# Patient Record
Sex: Female | Born: 2005 | Race: Black or African American | Hispanic: No | Marital: Single | Smoking: Never smoker
Health system: Southern US, Community
[De-identification: ages and names within clinical notes are randomized; demographics above are authoritative.]

## PROBLEM LIST (undated history)

## (undated) DIAGNOSIS — T7840XA Allergy, unspecified, initial encounter: Secondary | ICD-10-CM

## (undated) HISTORY — DX: Allergy, unspecified, initial encounter: T78.40XA

---
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
## 2024-02-26 ENCOUNTER — Other Ambulatory Visit: Payer: Self-pay | Admitting: Pediatrics

## 2024-02-26 NOTE — Progress Notes (Signed)
Letter written for school.

## 2024-04-14 ENCOUNTER — Encounter: Payer: Self-pay | Admitting: Pediatrics

## 2024-04-14 ENCOUNTER — Ambulatory Visit: Admitting: Pediatrics

## 2024-04-14 VITALS — BP 112/70 | Ht 66.0 in | Wt 148.4 lb

## 2024-04-14 DIAGNOSIS — Z0000 Encounter for general adult medical examination without abnormal findings: Secondary | ICD-10-CM

## 2024-04-14 DIAGNOSIS — Z68.41 Body mass index (BMI) pediatric, 5th percentile to less than 85th percentile for age: Secondary | ICD-10-CM

## 2024-04-14 DIAGNOSIS — Z23 Encounter for immunization: Secondary | ICD-10-CM

## 2024-04-14 NOTE — Progress Notes (Signed)
 Subjective:     Anne Cameron is a 18 y.o. female and is here for a comprehensive physical exam. The patient reports no problems.  Social History   Socioeconomic History   Marital status: Single    Spouse name: Not on file   Number of children: Not on file   Years of education: Not on file   Highest education level: Not on file  Occupational History   Not on file  Tobacco Use   Smoking status: Never   Smokeless tobacco: Never  Vaping Use   Vaping status: Never Used  Substance and Sexual Activity   Alcohol use: No   Drug use: No   Sexual activity: Never    Birth control/protection: Abstinence  Other Topics Concern   Not on file  Social History Narrative   11th grade at [employer], track, cheerleading   Social Drivers of Health   Financial Resource Strain: Low Risk  (09/23/2018)   Overall Financial Resource Strain (CARDIA)    Difficulty of Paying Living Expenses: Not hard at all  Food Insecurity: Unknown (09/23/2018)   Hunger Vital Sign    Worried About Programme researcher, broadcasting/film/video in the Last Year: Patient declined    Barista in the Last Year: Patient declined  Transportation Needs: Unknown (09/23/2018)   PRAPARE - Administrator, Civil Service (Medical): Patient declined    Lack of Transportation (Non-Medical): Patient declined  Physical Activity: Not on file  Stress: Not on file  Social Connections: Not on file  Intimate Partner Violence: Not on file   Health Maintenance  Topic Date Due   HPV VACCINES (1 - 3-dose series) Never done   HIV Screening  Never done   Hepatitis C Screening  Never done   COVID-19 Vaccine (1 - 2025-26 season) Never done   DTaP/Tdap/Td (6 - Td or Tdap) 08/13/2025   Pneumococcal Vaccine  Completed   Hepatitis B Vaccines 19-59 Average Risk  Completed   Meningococcal B Vaccine  Completed   Influenza Vaccine  Discontinued    The following portions of the patient's history were reviewed and updated as appropriate: allergies,  current medications, past family history, past medical history, past social history, past surgical history, and problem list.  Review of Systems Pertinent items are noted in HPI.   Objective:    BP 112/70   Ht 5' 6 (1.676 m)   Wt 148 lb 6 oz (67.3 kg)   BMI 23.95 kg/m  General appearance: alert, cooperative, appears stated age, and no distress Head: Normocephalic, without obvious abnormality, atraumatic Eyes: conjunctivae/corneas clear. PERRL, EOM's intact. Fundi benign. Ears: normal TM's and external ear canals both ears Nose: Nares normal. Septum midline. Mucosa normal. No drainage or sinus tenderness. Throat: lips, mucosa, and tongue normal; teeth and gums normal Neck: no adenopathy, no carotid bruit, no JVD, supple, symmetrical, trachea midline, and thyroid not enlarged, symmetric, no tenderness/mass/nodules Lungs: clear to auscultation bilaterally Heart: regular rate and rhythm, S1, S2 normal, no murmur, click, rub or gallop and normal apical impulse Abdomen: soft, non-tender; bowel sounds normal; no masses,  no organomegaly Extremities: extremities normal, atraumatic, no cyanosis or edema Skin: Skin color, texture, turgor normal. No rashes or lesions Neurologic: Grossly normal    Assessment:    Healthy female exam.      Plan:    Flu vaccine per orders. Indications, contraindications and side effects of vaccine/vaccines discussed with parent and parent verbally expressed understanding and also agreed with  the administration of vaccine/vaccines as ordered above today.Handout (VIS) given for each vaccine at this visit. See After Visit Summary for Counseling Recommendations

## 2024-04-14 NOTE — Patient Instructions (Signed)
 At [facility] we value your feedback. You may receive a survey about your visit today. Please share your experience as we strive to create trusting relationships with our patients to provide genuine, compassionate, quality care.   Preventive Care 43-19 Years Old, Female Preventive care refers to lifestyle choices and visits with your health care provider that can promote health and wellness. At this stage in your life, you may start seeing a primary care physician instead of a pediatrician for your preventive care. Preventive care visits are also called wellness exams. What can I expect for my preventive care visit? Counseling During your preventive care visit, your health care provider may ask about your: Medical history, including: Past medical problems. Family medical history. Pregnancy history. Current health, including: Menstrual cycle. Method of birth control. Emotional well-being. Home life and relationship well-being. Sexual activity and sexual health. Lifestyle, including: Alcohol, nicotine or tobacco, and drug use. Access to firearms. Diet, exercise, and sleep habits. Sunscreen use. Motor vehicle safety. Physical exam Your health care provider may check your: Height and weight. These may be used to calculate your BMI (body mass index). BMI is a measurement that tells if you are at a healthy weight. Waist circumference. This measures the distance around your waistline. This measurement also tells if you are at a healthy weight and may help predict your risk of certain diseases, such as type 2 diabetes and high blood pressure. Heart rate and blood pressure. Body temperature. Skin for abnormal spots. Breasts. What immunizations do I need? Vaccines are usually given at various ages, according to a schedule. Your health care provider will recommend vaccines for you based on your age, medical history, and lifestyle or other factors, such as travel or where you  work. What tests do I need? Screening Your health care provider may recommend screening tests for certain conditions. This may include: Vision and hearing tests. Lipid and cholesterol levels. Pelvic exam and Pap test. Hepatitis B test. Hepatitis C test. HIV (human immunodeficiency virus) test. STI (sexually transmitted infection) testing, if you are at risk. Tuberculosis skin test if you have symptoms. BRCA-related cancer screening. This may be done if you have a family history of breast, ovarian, tubal, or peritoneal cancers. Talk with your health care provider about your test results, treatment options, and if necessary, the need for more tests. Follow these instructions at home: Eating and drinking Eat a healthy diet that includes fresh fruits and vegetables, whole grains, lean protein, and low-fat dairy products. Drink enough fluid to keep your urine pale yellow. Do not drink alcohol if: Your health care provider tells you not to drink. You are pregnant, may be pregnant, or are planning to become pregnant. You are under the legal drinking age. In the [place] the legal drinking age is 40. If you drink alcohol: Limit how much you have to 0-1 drink a day. Know how much alcohol is in your drink. In the [place] one drink equals one 12 oz bottle of beer (355 mL), one 5 oz glass of wine (148 mL), or one 1 oz glass of hard liquor (44 mL). Lifestyle Brush your teeth every morning and night with fluoride toothpaste. Floss one time each day. Exercise for at least 30 minutes 5 or more days of the week. Do not use any products that contain nicotine or tobacco. These products include cigarettes, chewing tobacco, and vaping devices, such as e-cigarettes. If you need help quitting, ask your health care provider. Do not use drugs. If you are  sexually active, practice safe sex. Use a condom or other form of protection to prevent STIs. If you do not wish to become pregnant, use a form of birth control.  If you plan to become pregnant, see your health care provider for a prepregnancy visit. Find healthy ways to manage stress, such as: Meditation, yoga, or listening to music. Journaling. Talking to a trusted person. Spending time with friends and family. Safety Always wear your seat belt while driving or riding in a vehicle. Do not drive: If you have been drinking alcohol. Do not ride with someone who has been drinking. When you are tired or distracted. While texting. If you have been using any mind-altering substances or drugs. Wear a helmet and other protective equipment during sports activities. If you have firearms in your house, make sure you follow all gun safety procedures. Seek help if you have been bullied, physically abused, or sexually abused. Use the internet responsibly to avoid dangers, such as online bullying and online sex predators. What's next? Go to your health care provider once a year for an annual wellness visit. Ask your health care provider how often you should have your eyes and teeth checked. Stay up to date on all vaccines. This information is not intended to replace advice given to you by your health care provider. Make sure you discuss any questions you have with your health care provider. Document Revised: 12/19/2020 Document Reviewed: 12/19/2020 Elsevier Patient Education  2024 [employer].
